# Patient Record
Sex: Female | Born: 1960 | Race: Black or African American | Hispanic: No | Marital: Married | Smoking: Never smoker
Health system: Southern US, Community
[De-identification: ages and names within clinical notes are randomized; demographics above are authoritative.]

## PROBLEM LIST (undated history)

## (undated) DIAGNOSIS — I10 Essential (primary) hypertension: Secondary | ICD-10-CM

---

## 2018-04-24 DIAGNOSIS — Z049 Encounter for examination and observation for unspecified reason: Secondary | ICD-10-CM | POA: Diagnosis not present

## 2018-04-24 LAB — HM PAP SMEAR: HM Pap smear: NORMAL

## 2018-04-25 DIAGNOSIS — Z01419 Encounter for gynecological examination (general) (routine) without abnormal findings: Secondary | ICD-10-CM | POA: Diagnosis not present

## 2018-04-25 DIAGNOSIS — Z111 Encounter for screening for respiratory tuberculosis: Secondary | ICD-10-CM | POA: Diagnosis not present

## 2018-04-25 DIAGNOSIS — Z049 Encounter for examination and observation for unspecified reason: Secondary | ICD-10-CM | POA: Diagnosis not present

## 2018-04-25 DIAGNOSIS — Z206 Contact with and (suspected) exposure to human immunodeficiency virus [HIV]: Secondary | ICD-10-CM | POA: Diagnosis not present

## 2018-04-25 DIAGNOSIS — Z23 Encounter for immunization: Secondary | ICD-10-CM | POA: Diagnosis not present

## 2018-05-07 ENCOUNTER — Ambulatory Visit: Payer: Self-pay | Admitting: Family Medicine

## 2018-05-15 ENCOUNTER — Ambulatory Visit: Payer: Self-pay | Admitting: Family Medicine

## 2018-05-30 ENCOUNTER — Ambulatory Visit (INDEPENDENT_AMBULATORY_CARE_PROVIDER_SITE_OTHER): Payer: Medicaid Other | Admitting: Family Medicine

## 2018-05-30 ENCOUNTER — Other Ambulatory Visit: Payer: Self-pay

## 2018-05-30 ENCOUNTER — Encounter: Payer: Self-pay | Admitting: Family Medicine

## 2018-05-30 ENCOUNTER — Emergency Department (HOSPITAL_COMMUNITY)
Admission: EM | Admit: 2018-05-30 | Discharge: 2018-05-30 | Disposition: A | Discharge status: Home / Self Care | Payer: Medicaid Other | Attending: Emergency Medicine | Admitting: Emergency Medicine

## 2018-05-30 ENCOUNTER — Encounter (HOSPITAL_COMMUNITY): Payer: Self-pay | Admitting: Emergency Medicine

## 2018-05-30 ENCOUNTER — Other Ambulatory Visit: Payer: Self-pay | Admitting: Family Medicine

## 2018-05-30 ENCOUNTER — Emergency Department (HOSPITAL_COMMUNITY): Payer: Medicaid Other

## 2018-05-30 VITALS — BP 151/96 | HR 72 | Resp 17 | Ht 66.0 in | Wt 183.6 lb

## 2018-05-30 DIAGNOSIS — R739 Hyperglycemia, unspecified: Secondary | ICD-10-CM | POA: Insufficient documentation

## 2018-05-30 DIAGNOSIS — R112 Nausea with vomiting, unspecified: Secondary | ICD-10-CM | POA: Diagnosis not present

## 2018-05-30 DIAGNOSIS — I10 Essential (primary) hypertension: Secondary | ICD-10-CM

## 2018-05-30 DIAGNOSIS — R002 Palpitations: Secondary | ICD-10-CM | POA: Diagnosis not present

## 2018-05-30 DIAGNOSIS — Z13220 Encounter for screening for lipoid disorders: Secondary | ICD-10-CM

## 2018-05-30 DIAGNOSIS — R0789 Other chest pain: Secondary | ICD-10-CM | POA: Insufficient documentation

## 2018-05-30 DIAGNOSIS — E119 Type 2 diabetes mellitus without complications: Secondary | ICD-10-CM

## 2018-05-30 DIAGNOSIS — R42 Dizziness and giddiness: Secondary | ICD-10-CM | POA: Diagnosis present

## 2018-05-30 HISTORY — DX: Essential (primary) hypertension: I10

## 2018-05-30 LAB — CBC
HCT: 44.2 % (ref 36.0–46.0)
HEMOGLOBIN: 14.6 g/dL (ref 12.0–15.0)
MCH: 28.1 pg (ref 26.0–34.0)
MCHC: 33 g/dL (ref 30.0–36.0)
MCV: 85 fL (ref 80.0–100.0)
Platelets: 176 10*3/uL (ref 150–400)
RBC: 5.2 MIL/uL — ABNORMAL HIGH (ref 3.87–5.11)
RDW: 11.8 % (ref 11.5–15.5)
WBC: 5.6 10*3/uL (ref 4.0–10.5)
nRBC: 0 % (ref 0.0–0.2)

## 2018-05-30 LAB — BASIC METABOLIC PANEL
ANION GAP: 11 (ref 5–15)
BUN: 14 mg/dL (ref 6–20)
CO2: 22 mmol/L (ref 22–32)
Calcium: 9.7 mg/dL (ref 8.9–10.3)
Chloride: 102 mmol/L (ref 98–111)
Creatinine, Ser: 0.92 mg/dL (ref 0.44–1.00)
GFR calc Af Amer: >60 mL/min (ref >60)
GFR calc non Af Amer: >60 mL/min (ref >60)
Glucose, Bld: 398 mg/dL — ABNORMAL HIGH (ref 70–99)
POTASSIUM: 3.8 mmol/L (ref 3.5–5.1)
Sodium: 135 mmol/L (ref 135–145)

## 2018-05-30 LAB — CBG MONITORING, ED
GLUCOSE-CAPILLARY: 340 mg/dL — AB (ref 70–99)
Glucose-Capillary: 412 mg/dL — ABNORMAL HIGH (ref 70–99)
Glucose-Capillary: 229 mg/dL — ABNORMAL HIGH (ref 70–99)

## 2018-05-30 LAB — GLUCOSE, POCT (MANUAL RESULT ENTRY): POC Glucose: 537 mg/dl — AB (ref 70–99)

## 2018-05-30 LAB — TROPONIN I: Troponin I: <0.03 ng/mL (ref <0.03)

## 2018-05-30 MED ORDER — INSULIN ASPART 100 UNIT/ML ~~LOC~~ SOLN
20.0000 [IU] | Freq: Once | SUBCUTANEOUS | Status: AC
  Administered 2018-05-30: 20 [IU] via SUBCUTANEOUS

## 2018-05-30 MED ORDER — METFORMIN HCL 1000 MG PO TABS: 1000.0000 mg | ORAL_TABLET | Freq: Two times a day (BID) | ORAL | 0 refills | Status: DC

## 2018-05-30 MED ORDER — SODIUM CHLORIDE 0.9 % IV BOLUS
1000.0000 mL | Freq: Once | INTRAVENOUS | Status: AC
  Administered 2018-05-30: 1000 mL via INTRAVENOUS

## 2018-05-30 NOTE — ED Provider Notes (Signed)
[facility] Provider Note   CSN: [id number] Arrival date & time: 05/30/18  1154     History   Chief Complaint Chief Complaint  Patient presents with  . Hypertension    HPI Diane Ponce is a 57 y.o. female.  57 year old female presents to the ER from PCP office for high blood sugar.  Patient went to PCP office today to establish care, was found to have a fasting blood sugar of 537 and was given 20 units of NovoLog prior to being sent to the ER for IV fluids and labs.  Patient reports feeling slightly nauseous, denies headache, dizziness, changes in vision.  Patient was given prescription for metformin with plan to return to PCP office tomorrow to finish her visit.  No other complaints or concerns today.  Patient's son is her translator today, history also obtained from PCP notes from visit today.     Past Medical History:  Diagnosis Date  . Hypertension     There are no active problems to display for this patient.   History reviewed. No pertinent surgical history.   OB History   No obstetric history on file.      Home Medications    Prior to Admission medications   Medication Sig Start Date End Date Taking? Authorizing Provider  metFORMIN (GLUCOPHAGE) 1000 MG tablet Take 1 tablet (1,000 mg total) by mouth 2 (two) times daily with a meal. 05/30/18   Bing NeighborsHarris, Kimberly S, FNP    Family History History reviewed. No pertinent family history.  Social History Social History   Tobacco Use  . Smoking status: Never Smoker  . Smokeless tobacco: Never Used  Substance Use Topics  . Alcohol use: Never    Frequency: Never  . Drug use: Never     Allergies   Patient has no known allergies.   Review of Systems Review of Systems  Constitutional: Negative for chills and fever.  Eyes: Negative for visual disturbance.  Respiratory: Negative for shortness of breath.   Cardiovascular: Negative for chest pain.    Gastrointestinal: Positive for nausea. Negative for abdominal pain, constipation, diarrhea and vomiting.  Allergic/Immunologic: Positive for immunocompromised state.  Neurological: Negative for dizziness, weakness and headaches.  All other systems reviewed and are negative.    Physical Exam Updated Vital Signs BP 135/89 (BP Location: Right Arm)   Pulse 73   Temp 97.6 F (36.4 C) (Oral)   Resp 16   SpO2 100%   Physical Exam Vitals signs and nursing note reviewed.  Constitutional:      General: She is not in acute distress.    Appearance: She is well-developed. She is not diaphoretic.  HENT:     Head: Normocephalic and atraumatic.     Mouth/Throat:     Mouth: Mucous membranes are moist.  Cardiovascular:     Rate and Rhythm: Normal rate and regular rhythm.     Pulses: Normal pulses.     Heart sounds: Normal heart sounds. No murmur.  Pulmonary:     Effort: Pulmonary effort is normal.  Abdominal:     Tenderness: There is no abdominal tenderness.  Skin:    General: Skin is warm and dry.  Neurological:     Mental Status: She is alert and oriented to person, place, and time.  Psychiatric:        Behavior: Behavior normal.      ED Treatments / Results  Labs (all labs ordered are listed, but only abnormal results are displayed)  Labs Reviewed  BASIC METABOLIC PANEL - Abnormal; Notable for the following components:      Result Value   Glucose, Bld 398 (*)    All other components within normal limits  CBC - Abnormal; Notable for the following components:   RBC 5.20 (*)    All other components within normal limits  CBG MONITORING, ED - Abnormal; Notable for the following components:   Glucose-Capillary 412 (*)    All other components within normal limits  CBG MONITORING, ED - Abnormal; Notable for the following components:   Glucose-Capillary 340 (*)    All other components within normal limits  CBG MONITORING, ED - Abnormal; Notable for the following components:    Glucose-Capillary 229 (*)    All other components within normal limits  TROPONIN I  URINALYSIS, ROUTINE W REFLEX MICROSCOPIC    EKG EKG Interpretation  Date/Time:  Thursday May 30 2018 13:21:48 EST Ventricular Rate:  70 PR Interval:  154 QRS Duration: 103 QT Interval:  381 QTC Calculation: 412 R Axis:   12 Text Interpretation:  Sinus rhythm No significant change since last tracing Confirmed by Linwood DibblesKnapp, Jon [phone]([id number]) on 05/30/2018 1:24:19 PM   Radiology Dg Chest 2 View  Result Date: 05/30/2018 CLINICAL DATA:  Hypertension, dizziness, palpitation EXAM: CHEST - 2 VIEW COMPARISON:  None. FINDINGS: Lungs are clear.  No pleural effusion or pneumothorax. The heart is normal in size. Visualized osseous structures are within normal limits. IMPRESSION: Normal chest radiographs. Electronically Signed   By: Charline BillsSriyesh  Krishnan M.D.   On: 05/30/2018 13:23    Procedures Procedures (including critical care time)  Medications Ordered in ED Medications  sodium chloride 0.9 % bolus 1,000 mL (0 mLs Intravenous Stopped 05/30/18 1517)     Initial Impression / Assessment and Plan / ED Course  I have reviewed the triage vital signs and the nursing notes.  Pertinent labs & imaging results that were available during my care of the patient were reviewed by me and considered in my medical decision making (see chart for details).  Clinical Course as of May 30 1553  Thu May 30, 2018  [id number] 57 year old female newly diagnosed with diabetes at PCP office today presents to the ER for IV fluids and labs.  Patient's blood sugar has improved after IV insulin given at her PCP office as well as 1 L normal saline.  Patient will be discharged to follow-up with her PCP tomorrow as planned.   [LM]    Clinical Course User Index [LM] Jeannie FendMurphy,  A, PA-C   Final Clinical Impressions(s) / ED Diagnoses   Final diagnoses:  Hyperglycemia    ED Discharge Orders    None       Jeannie FendMurphy,  A, PA-C 05/30/18  1554    Linwood DibblesKnapp, Jon, MD 05/31/18 [phone]

## 2018-05-30 NOTE — ED Notes (Signed)
Pt. Aware urine is needed.

## 2018-05-30 NOTE — Patient Instructions (Addendum)
[facility] immediately to [facility]. You will need IV fluids and probably additional insulin to treat your hyperglycemia. You will start Metformin 1000 mg once twice daily. We will add medication tomorrow once you labs have resulted   Thank you for choosing [facility]!    Diane Ponce was seen by Joaquin CourtsKimberly Kassidi [name], FNP today.   [name]'s primary care doctor is Bing NeighborsHarris, [name] S, FNP.   For the best care possible,  you should try to see Joaquin CourtsKimberly Sherilee Smotherman, FNP-C  whenever you come to clinic.   We look forward to seeing you again soon!  If you have any questions about your visit today,  please call us at [phone]  Or feel free to reach your provider via MyChart.        Hyperglycemia Hyperglycemia is when the sugar (glucose) level in your blood is too high. It may not cause symptoms. If you do have symptoms, they may include warning signs, such as:  Feeling more thirsty than normal.  Hunger.  Feeling tired.  Needing to pee (urinate) more than normal.  Blurry eyesight (vision). You may get other symptoms as it gets worse, such as:  Dry mouth.  Not being hungry (loss of appetite).  Fruity-smelling breath.  Weakness.  Weight gain or loss that is not planned. Weight loss may be fast.  A tingling or numb feeling in your hands or feet.  Headache.  Skin that does not bounce back quickly when it is lightly pinched and released (poor skin turgor).  Pain in your belly (abdomen).  Cuts or bruises that heal slowly. High blood sugar can happen to people who do or do not have diabetes. High blood sugar can happen slowly or quickly, and it can be an emergency. Follow these instructions at home: General instructions  Take over-the-counter and prescription medicines only as told by your doctor.  Do not use products that contain nicotine or tobacco, such as cigarettes and e-cigarettes. If  you need help quitting, ask your doctor.  Limit alcohol intake to no more than 1 drink per day for nonpregnant women and 2 drinks per day for men. One drink equals 12 oz of beer, 5 oz of wine, or 1 oz of hard liquor.  Manage stress. If you need help with this, ask your doctor.  Keep all follow-up visits as told by your doctor. This is important. Eating and drinking   Stay at a healthy weight.  Exercise regularly, as told by your doctor.  Drink enough fluid, especially when you: ? Exercise. ? Get sick. ? Are in hot temperatures.  Eat healthy foods, such as: ? Low-fat (lean) proteins. ? Complex carbs (complex carbohydrates), such as whole wheat bread or brown rice. ? Fresh fruits and vegetables. ? Low-fat dairy products. ? Healthy fats.  Drink enough fluid to keep your pee (urine) clear or pale yellow. If you have diabetes:   Make sure you know the symptoms of hyperglycemia.  Follow your diabetes management plan, as told by your doctor. Make sure you: ? Take insulin and medicines as told. ? Follow your exercise plan. ? Follow your meal plan. Eat on time. Do not skip meals. ? Check your blood sugar as often as told. Make sure to check before and after exercise. If you exercise longer or in a different way than you normally do, check your blood sugar more often. ? Follow your sick day plan whenever you cannot eat or drink normally.  Make this plan ahead of time with your doctor.  Share your diabetes management plan with people in your workplace, school, and household.  Check your urine for ketones when you are ill and as told by your doctor.  Carry a card or wear jewelry that says that you have diabetes. Contact a doctor if:  Your blood sugar level is higher than 240 mg/dL (16.113.3 mmol/L) for 2 days in a row.  You have problems keeping your blood sugar in your target range.  High blood sugar happens often for you. Get help right away if:  You have trouble  breathing.  You have a change in how you think, feel, or act (mental status).  You feel sick to your stomach (nauseous), and that feeling does not go away.  You cannot stop throwing up (vomiting). These symptoms may be an emergency. Do not wait to see if the symptoms will go away. Get medical help right away. Call your local emergency services (911 in the [place] Do not drive yourself to the hospital. Summary  Hyperglycemia is when the sugar (glucose) level in your blood is too high.  High blood sugar can happen to people who do or do not have diabetes.  Make sure you drink enough fluids, eat healthy foods, and exercise regularly.  Contact your doctor if you have problems keeping your blood sugar in your target range. This information is not intended to replace advice given to you by your health care provider. Make sure you discuss any questions you have with your health care provider. Document Released: 03/19/2009 Document Revised: 02/07/2016 Document Reviewed: 02/07/2016 Elsevier Interactive Patient Education  2019 [employer]

## 2018-05-30 NOTE — Progress Notes (Signed)
Diane Ponce, is a 57 y.o. female  RUE:[id number]  JYN:[id number]  DOB - 10/25/1960  CC:  Chief Complaint  Patient presents with  . Establish Care  . Diabetes  . Hypertension       HPI: Diane Ponce is a 57 y.o. female is here today to establish care.   Diane Ponce does not have a problem list on file.   Today's visit:  Patient presents today as a new patient and glucose 537 fasting. She is being directed to the emergency department due to critical glucose level. She is currently not taking medication for diabetes. [place] and today is first appointment with PCP.  Returning tomorrow to complete visit after further evaluation at the ER. Denies chest pain, shortness of breath, dizziness, or weakness.   Current medications:No current outpatient medications on file.   Pertinent family medical history: family history is not on file.   Not on File  Social History   Socioeconomic History  . Marital status: Married    Spouse name: Not on file  . Number of children: Not on file  . Years of education: Not on file  . Highest education level: Not on file  Occupational History  . Not on file  Social Needs  . Financial resource strain: Not on file  . Food insecurity:    Worry: Not on file    Inability: Not on file  . Transportation needs:    Medical: Not on file    Non-medical: Not on file  Tobacco Use  . Smoking status: Never Smoker  . Smokeless tobacco: Never Used  Substance and Sexual Activity  . Alcohol use: Never    Frequency: Never  . Drug use: Never  . Sexual activity: Not on file  Lifestyle  . Physical activity:    Days per week: Not on file    Minutes per session: Not on file  . Stress: Not on file  Relationships  . Social connections:    Talks on phone: Not on file    Gets together: Not on file    Attends religious service: Not on file    Active member of club or organization: Not on file    Attends meetings of clubs or  organizations: Not on file    Relationship status: Not on file  . Intimate partner violence:    Fear of current or ex partner: Not on file    Emotionally abused: Not on file    Physically abused: Not on file    Forced sexual activity: Not on file  Other Topics Concern  . Not on file  Social History Narrative  . Not on file    Review of Systems: Deferred Objective:   Vitals:   05/30/18 1028  BP: (!) 151/96  Pulse: 72  Resp: 17  SpO2: 96%    BP Readings from Last 3 Encounters:  05/30/18 (!) 151/96    Filed Weights   05/30/18 1028  Weight: 183 lb 9.6 oz (83.3 kg)      Physical Exam: Constitutional: Patient appears well-developed and well-nourished. No distress. HENT: Normocephalic, atraumatic, External right and left ear normal. Oropharynx is clear and moist.  Eyes: Conjunctivae and EOM are normal. PERRLA, no scleral icterus. Neck: Normal ROM. Neck supple. No JVD. No tracheal deviation. No thyromegaly. CVS: RRR, S1/S2 +, no murmurs, no gallops, no carotid bruit.  Pulmonary: Effort and breath sounds normal, no stridor, rhonchi, wheezes, rales.  Psychiatric: Normal mood and affect. Behavior, judgment, thought content normal.  Assessment and plan:  1. Type 2 diabetes mellitus without complication, without long-term current use of insulin (HCC) - Glucose (CBG) - Hemoglobin A1c  2. Hyperglycemia, fasting  Blood sugar on arrival 537, referring to ER for further evaluation. Gave Iinsulin aspart (novoLOG) injection 20 Units, prior to discharge. Patient will need fluids.   Return for follow-up tomorrow at 10:10 .   The patient was given clear instructions to go to ER or return to medical center if symptoms don't improve, worsen or new problems develop. The patient verbalized understanding. The patient was advised  to call and obtain lab results if they haven't heard anything from out office within 7-10 business days.  Joaquin CourtsKimberly Taegan Standage, FNP [facility] [address], [place] [id number] [phone]: [phone]    This note has been created with Dragon speech recognition software and Paediatric nursesmart phrase technology. Any transcriptional errors are unintentional.

## 2018-05-30 NOTE — ED Triage Notes (Signed)
Pt was sent over by PCP for high blood pressure. Pt reports hx of high blood pressure, but does not take any medications. Pt reports dizziness since Monday. Pt reports palpitations, denies chest pain.

## 2018-05-30 NOTE — ED Provider Notes (Signed)
Patient placed in Quick Look pathway, seen and evaluated   Chief Complaint: Hypertension, dizziness, nausea  HPI:   Patient reports she has history of hypertension for 3 years.  She does not take any medication.  For the past month, she has had intermittent dizziness and nausea.  She was seen at her primary care provider today and sent here for further evaluation.  ROS: + chest pain, + intermittent shortness of breath, + nausea, -vomiting, -abdominal pain  Physical Exam:   Gen: No distress  Neuro: Awake and Alert  Skin: Warm    Focused Exam: Heart normal rate and rhythm, lungs clear to auscultation, abdomen soft and nontender CN 3-12 intact; normal sensation throughout; 5/5 strength in all 4 extremities; equal bilateral grip strength    Initiation of care has begun. The patient has been counseled on the process, plan, and necessity for staying for the completion/evaluation, and the remainder of the medical screening examination    Emi HolesLaw, Aubrynn Katona M, PA-C 05/30/18 1227    Melene PlanFloyd, Dan, DO 05/30/18 1333

## 2018-05-30 NOTE — Discharge Instructions (Addendum)
Take metformin as prescribed by your doctor today. Return to your doctor's office tomorrow for appointment.

## 2018-05-31 ENCOUNTER — Ambulatory Visit: Payer: Medicaid Other | Admitting: Family Medicine

## 2018-05-31 ENCOUNTER — Telehealth: Payer: Self-pay | Admitting: Family Medicine

## 2018-05-31 LAB — THYROID PANEL WITH TSH
Free Thyroxine Index: 2.4 (ref 1.2–4.9)
T3 Uptake Ratio: 27 % (ref 24–39)
T4, Total: 8.8 ug/dL (ref 4.5–12.0)
TSH: 0.307 u[IU]/mL — ABNORMAL LOW (ref 0.450–4.500)

## 2018-05-31 LAB — LIPID PANEL
CHOL/HDL RATIO: 6.5 ratio — AB (ref 0.0–4.4)
Cholesterol, Total: 229 mg/dL — ABNORMAL HIGH (ref 100–199)
HDL: 35 mg/dL — ABNORMAL LOW (ref >39)
LDL Calculated: 137 mg/dL — ABNORMAL HIGH (ref 0–99)
Triglycerides: 286 mg/dL — ABNORMAL HIGH (ref 0–149)
VLDL Cholesterol Cal: 57 mg/dL — ABNORMAL HIGH (ref 5–40)

## 2018-05-31 LAB — HEMOGLOBIN A1C
Est. average glucose Bld gHb Est-mCnc: 301 mg/dL
HEMOGLOBIN A1C: 12.1 % — AB (ref 4.8–5.6)

## 2018-05-31 NOTE — Telephone Encounter (Signed)
erroneous

## 2018-06-03 ENCOUNTER — Encounter: Payer: Self-pay | Admitting: Family Medicine

## 2018-06-03 NOTE — Progress Notes (Signed)
Discuss f/u visit 06/11/18

## 2018-06-11 ENCOUNTER — Encounter: Payer: Self-pay | Admitting: Family Medicine

## 2018-06-11 ENCOUNTER — Ambulatory Visit (INDEPENDENT_AMBULATORY_CARE_PROVIDER_SITE_OTHER): Payer: Medicaid Other | Admitting: Family Medicine

## 2018-06-11 VITALS — BP 144/93 | HR 63 | Resp 17 | Ht 66.0 in | Wt 183.8 lb

## 2018-06-11 DIAGNOSIS — E119 Type 2 diabetes mellitus without complications: Secondary | ICD-10-CM

## 2018-06-11 DIAGNOSIS — E1165 Type 2 diabetes mellitus with hyperglycemia: Secondary | ICD-10-CM

## 2018-06-11 DIAGNOSIS — I10 Essential (primary) hypertension: Secondary | ICD-10-CM | POA: Diagnosis not present

## 2018-06-11 DIAGNOSIS — R739 Hyperglycemia, unspecified: Secondary | ICD-10-CM

## 2018-06-11 LAB — GLUCOSE, POCT (MANUAL RESULT ENTRY): POC Glucose: 307 mg/dl — AB (ref 70–99)

## 2018-06-11 MED ORDER — LOSARTAN POTASSIUM 50 MG PO TABS: 50.0000 mg | ORAL_TABLET | Freq: Every day | ORAL | 3 refills | Status: DC

## 2018-06-11 MED ORDER — METFORMIN HCL 1000 MG PO TABS: 1000.0000 mg | ORAL_TABLET | Freq: Two times a day (BID) | ORAL | 3 refills | Status: DC

## 2018-06-11 MED ORDER — INSULIN PEN NEEDLE 30G X 8 MM MISC: 1.0000 | 11 refills | Status: DC | PRN

## 2018-06-11 MED ORDER — INSULIN ASPART 100 UNIT/ML ~~LOC~~ SOLN
10.0000 [IU] | Freq: Once | SUBCUTANEOUS | Status: AC
  Administered 2018-06-11: 10 [IU] via SUBCUTANEOUS

## 2018-06-11 MED ORDER — BLOOD GLUCOSE METER KIT: PACK | 0 refills | Status: DC

## 2018-06-11 MED ORDER — HYDROCHLOROTHIAZIDE 12.5 MG PO TABS: 12.5000 mg | ORAL_TABLET | Freq: Every day | ORAL | 3 refills | Status: DC

## 2018-06-11 MED ORDER — INSULIN GLARGINE 100 UNIT/ML SOLOSTAR PEN: 10.0000 [IU] | PEN_INJECTOR | Freq: Every day | SUBCUTANEOUS | 99 refills | Status: DC

## 2018-06-11 NOTE — Progress Notes (Signed)
Established Patient Office Visit  Subjective:  Patient ID: Diane Ponce, female    DOB: Aug 13, 1960  Age: 58 y.o. MRN: [id number]  CC:  Chief Complaint  Patient presents with  . Diabetes  . Hypertension  . Hyperlipidemia   HPI Diane Ponce presents for evaluattion of diabetes, hypertension, and hyperlipidemia. Patient reports a history of diabetes which was managed in her home country. She presented to the office over a week ago and was found to have blood glucose 537 with ketones present in urine. She was sent to the ER for further evaluation and management.  Started on metformin 1000 mg once daily. A1C >12. She will presents today for follow-up. She does not check her blood sugar.  There is a language barrier.Patient is an immigrant from [place]. She was unaware that she had diabetes however has been treated in the past for high blood pressure. Currently not described any medication for hypertension.  Denies chest pain, shortness of breath, weakness, numbness and tingling of distal extremities,, or new weakness. Past Medical History:  Diagnosis Date  . Hypertension     Social History   Socioeconomic History  . Marital status: Married    Spouse name: Not on file  . Number of children: Not on file  . Years of education: Not on file  . Highest education level: Not on file  Occupational History  . Not on file  Social Needs  . Financial resource strain: Not on file  . Food insecurity:    Worry: Not on file    Inability: Not on file  . Transportation needs:    Medical: Not on file    Non-medical: Not on file  Tobacco Use  . Smoking status: Never Smoker  . Smokeless tobacco: Never Used  Substance and Sexual Activity  . Alcohol use: Never    Frequency: Never  . Drug use: Never  . Sexual activity: Not on file  Lifestyle  . Physical activity:    Days per week: Not on file    Minutes per session: Not on file  . Stress: Not on file  Relationships  .  Social connections:    Talks on phone: Not on file    Gets together: Not on file    Attends religious service: Not on file    Active member of club or organization: Not on file    Attends meetings of clubs or organizations: Not on file    Relationship status: Not on file  . Intimate partner violence:    Fear of current or ex partner: Not on file    Emotionally abused: Not on file    Physically abused: Not on file    Forced sexual activity: Not on file  Other Topics Concern  . Not on file  Social History Narrative  . Not on file    Outpatient Medications Prior to Visit  Medication Sig Dispense Refill  . metFORMIN (GLUCOPHAGE) 1000 MG tablet TAKE 1 TABLET BY MOUTH TWICE DAILY WITH A MEAL (Patient not taking: Reported on 06/11/2018) 180 tablet 1   No facility-administered medications prior to visit.     No Known Allergies  ROS Review of Systems    Pertinent negatives listed in HPI Objective:    Physical Exam BP (!) 144/93   Pulse 63   Resp 17   Ht 5' 6"  (1.676 m)   Wt 183 lb 12.8 oz (83.4 kg)   SpO2 96%   BMI 29.67 kg/m  Wt Readings from Last 3  Encounters:  06/11/18 183 lb 12.8 oz (83.4 kg)  05/30/18 183 lb 9.6 oz (83.3 kg)   General appearance: alert, well developed, well nourished, cooperative and in no distress Head: Normocephalic, without obvious abnormality, atraumatic Respiratory: Respirations even and unlabored, normal respiratory rate Heart: RRR normal, S1+, S2 +, no mumurs, no gallops  Extremities: No gross deformities Skin: Skin color, texture, turgor normal. No rashes seen  Psych: Appropriate mood and affect. Neurologic: Mental status: Alert, oriented to person, place, and time, thought content appropriate. Health Maintenance Due  Topic Date Due  . Hepatitis C Screening  1960-11-19  . URINE MICROALBUMIN  06/05/1972  . HIV Screening  06/05/1977  . TETANUS/TDAP  06/05/1981  . MAMMOGRAM  06/05/2012  . COLONOSCOPY  06/05/2012  . INFLUENZA VACCINE   01/03/2018    There are no preventive care reminders to display for this patient.  Lab Results  Component Value Date   TSH 0.307 (L) 05/30/2018   Lab Results  Component Value Date   WBC 5.6 05/30/2018   HGB 14.6 05/30/2018   HCT 44.2 05/30/2018   MCV 85.0 05/30/2018   PLT 176 05/30/2018   Lab Results  Component Value Date   NA 135 05/30/2018   K 3.8 05/30/2018   CO2 22 05/30/2018   GLUCOSE 398 (H) 05/30/2018   BUN 14 05/30/2018   CREATININE 0.92 05/30/2018   CALCIUM 9.7 05/30/2018   ANIONGAP 11 05/30/2018   Lab Results  Component Value Date   CHOL 229 (H) 05/30/2018   Lab Results  Component Value Date   HDL 35 (L) 05/30/2018   Lab Results  Component Value Date   LDLCALC 137 (H) 05/30/2018   Lab Results  Component Value Date   TRIG 286 (H) 05/30/2018   Lab Results  Component Value Date   CHOLHDL 6.5 (H) 05/30/2018   Lab Results  Component Value Date   HGBA1C 12.1 (H) 05/30/2018      Assessment & Plan:  1. Type 2 diabetes mellitus without complication, without long-term current use of insulin ([place]),- Glucose (CBG), 307 today. Never picked up metformin. -Even A1c indicating severely uncontrolled diabetes will start on Lantus 10 units once daily Continue metformin 1000 mg twice daily. Patient was demonstrated use of insulin and I was able to return demonstration and understands how to use.  Instructions were written in English she reports she has someone in home that can review the instructions.  2. Essential hypertension, uncontrolled Will start losartan 50 mg once daily. Education provided regarding reducing sodium.  TSH indicates overactive thyroid.  Will repeat TSH at next follow-up.  3. Hyperglycemia, BG 307 - insulin aspart (novoLOG) injection 10 Units   Orders Placed This Encounter  Procedures  . Glucose (CBG)   Meds ordered this encounter  Medications  . Insulin Glargine (LANTUS SOLOSTAR) 100 UNIT/ML Solostar Pen    Sig: Inject 10 Units  into the skin daily.    Dispense:  5 pen    Refill:  PRN  . metFORMIN (GLUCOPHAGE) 1000 MG tablet    Sig: Take 1 tablet (1,000 mg total) by mouth 2 (two) times daily with a meal.    Dispense:  180 tablet    Refill:  3  . Insulin Pen Needle (NOVOFINE) 30G X 8 MM MISC    Sig: Inject 10 each into the skin as needed.    Dispense:  100 each    Refill:  11  . blood glucose meter kit and supplies    Sig: Dispense  based on patient and insurance preference. Use up to four times daily as directed. (FOR ICD-10 E10.9, E11.9).    Dispense:  1 each    Refill:  0    Order Specific Question:   Number of strips    Answer:   300    Order Specific Question:   Number of lancets    Answer:   497  . insulin aspart (novoLOG) injection 10 Units  . hydrochlorothiazide (HYDRODIURIL) 12.5 MG tablet    Sig: Take 1 tablet (12.5 mg total) by mouth daily.    Dispense:  90 tablet    Refill:  3  . losartan (COZAAR) 50 MG tablet    Sig: Take 1 tablet (50 mg total) by mouth daily.    Dispense:  90 tablet    Refill:  3   Current in 4 weeks for diabetes hypertension follow-up.  Molli Barrows, FNP Primary Care at [facility] [address], [place] [phone]: [phone]

## 2018-06-11 NOTE — Patient Instructions (Signed)
I am starting you today on Lantus 10 units to be given in the abdomen prior to bedtime.  You should rotate sites of injections to reduce risk of skin complications. I have also prescribed you a glucose meter to check your blood sugar.  Blood sugar readings less than 125 you should not administer Lantus.  You will continue to take metformin 1000 mg twice daily with food. I am starting you on blood pressure medication losartan 50 mg once daily and hydrochlorothiazide 12.5 mg once daily to reduce swelling and to help improve blood pressure control.  You will return in 4 weeks for diabetes follow-up visit.     Blood Glucose Monitoring, Adult Monitoring your blood sugar (glucose) is an important part of managing your diabetes (diabetes mellitus). Blood glucose monitoring involves checking your blood glucose as often as directed and keeping a record (log) of your results over time. Checking your blood glucose regularly and keeping a blood glucose log can:  Help you and your health care provider adjust your diabetes management plan as needed, including your medicines or insulin.  Help you understand how food, exercise, illnesses, and medicines affect your blood glucose.  Let you know what your blood glucose is at any time. You can quickly find out if you have low blood glucose (hypoglycemia) or high blood glucose (hyperglycemia). Your health care provider will set individualized treatment goals for you. Your goals will be based on your age, other medical conditions you have, and how you respond to diabetes treatment. Generally, the goal of treatment is to maintain the following blood glucose levels:  Before meals (preprandial): 80-130 mg/dL (7.6-7.2 mmol/L).  After meals (postprandial): below 180 mg/dL (10 mmol/L).  A1c level: less than 7%. Supplies needed:  Blood glucose meter.  Test strips for your meter. Each meter has its own strips. You must use the strips that came with your meter.  A  needle to prick your finger (lancet). Do not use a lancet more than one time.  A device that holds the lancet (lancing device).  A journal or log book to write down your results. How to check your blood glucose  1. Wash your hands with soap and water. 2. Prick the side of your finger (not the tip) with the lancet. Use a different finger each time. 3. Gently rub the finger until a small drop of blood appears. 4. Follow instructions that come with your meter for inserting the test strip, applying blood to the strip, and using your blood glucose meter. 5. Write down your result and any notes. Some meters allow you to use areas of your body other than your finger (alternative sites) to test your blood. The most common alternative sites are:  Forearm.  Thigh.  Palm of the hand. If you think you may have hypoglycemia, or if you have a history of not knowing when your blood glucose is getting low (hypoglycemia unawareness), do not use alternative sites. Use your finger instead. Alternative sites may not be as accurate as the fingers, because blood flow is slower in these areas. This means that the result you get may be delayed, and it may be different from the result that you would get from your finger. Follow these instructions at home: Blood glucose log   Every time you check your blood glucose, write down your result. Also write down any notes about things that may be affecting your blood glucose, such as your diet and exercise for the day. This information can help  you and your health care provider: ? Look for patterns in your blood glucose over time. ? Adjust your diabetes management plan as needed.  Check if your meter allows you to download your records to a computer. Most glucose meters store a record of glucose readings in the meter. If you have type 1 diabetes:  Check your blood glucose 2 or more times a day.  Also check your blood glucose: ? Before every insulin  injection. ? Before and after exercise. ? Before meals. ? 2 hours after a meal. ? Occasionally between 2:00 a.m. and 3:00 a.m., as directed. ? Before potentially dangerous tasks, like driving or using heavy machinery. ? At bedtime.  You may need to check your blood glucose more often, up to 6-10 times a day, if you: ? Use an insulin pump. ? Need multiple daily injections (MDI). ? Have diabetes that is not well-controlled. ? Are ill. ? Have a history of severe hypoglycemia. ? Have hypoglycemia unawareness. If you have type 2 diabetes:  If you take insulin or other diabetes medicines, check your blood glucose 2 or more times a day.  If you are on intensive insulin therapy, check your blood glucose 4 or more times a day. Occasionally, you may also need to check between 2:00 a.m. and 3:00 a.m., as directed.  Also check your blood glucose: ? Before and after exercise. ? Before potentially dangerous tasks, like driving or using heavy machinery.  You may need to check your blood glucose more often if: ? Your medicine is being adjusted. ? Your diabetes is not well-controlled. ? You are ill. General tips  Always keep your supplies with you.  If you have questions or need help, all blood glucose meters have a 24-hour "hotline" phone number that you can call. You may also contact your health care provider.  After you use a few boxes of test strips, adjust (calibrate) your blood glucose meter by following instructions that came with your meter. Contact a health care provider if:  Your blood glucose is at or above 240 mg/dL (19.4 mmol/L) for 2 days in a row.  You have been sick or have had a fever for 2 days or longer, and you are not getting better.  You have any of the following problems for more than 6 hours: ? You cannot eat or drink. ? You have nausea or vomiting. ? You have diarrhea. Get help right away if:  Your blood glucose is lower than 60 mg/dL.  You become confused or  you have trouble thinking clearly.  You have difficulty breathing.  You have moderate or large ketone levels in your urine. Summary  Monitoring your blood sugar (glucose) is an important part of managing your diabetes (diabetes mellitus).  Blood glucose monitoring involves checking your blood glucose as often as directed and keeping a record (log) of your results over time.  Your health care provider will set individualized treatment goals for you. Your goals will be based on your age, other medical conditions you have, and how you respond to diabetes treatment.  Every time you check your blood glucose, write down your result. Also write down any notes about things that may be affecting your blood glucose, such as your diet and exercise for the day. This information is not intended to replace advice given to you by your health care provider. Make sure you discuss any questions you have with your health care provider. Document Released: 05/25/2003 Document Revised: 04/02/2017 Document Reviewed: 11/01/2015 Elsevier  Interactive Patient Education  [employer].

## 2018-07-08 ENCOUNTER — Ambulatory Visit: Payer: Medicaid Other | Admitting: Family Medicine

## 2018-07-31 ENCOUNTER — Ambulatory Visit (INDEPENDENT_AMBULATORY_CARE_PROVIDER_SITE_OTHER): Payer: Medicaid Other | Admitting: Family Medicine

## 2018-07-31 VITALS — BP 162/95 | HR 75 | Resp 17 | Ht 66.0 in | Wt 188.0 lb

## 2018-07-31 DIAGNOSIS — E1165 Type 2 diabetes mellitus with hyperglycemia: Secondary | ICD-10-CM

## 2018-07-31 DIAGNOSIS — I10 Essential (primary) hypertension: Secondary | ICD-10-CM

## 2018-07-31 DIAGNOSIS — R81 Glycosuria: Secondary | ICD-10-CM | POA: Diagnosis not present

## 2018-07-31 DIAGNOSIS — R739 Hyperglycemia, unspecified: Secondary | ICD-10-CM

## 2018-07-31 DIAGNOSIS — E119 Type 2 diabetes mellitus without complications: Secondary | ICD-10-CM

## 2018-07-31 LAB — POCT URINALYSIS DIP (CLINITEK)
Bilirubin, UA: NEGATIVE
Blood, UA: NEGATIVE
Glucose, UA: >=1000 mg/dL — AB
Ketones, POC UA: NEGATIVE mg/dL
Leukocytes, UA: NEGATIVE
Nitrite, UA: NEGATIVE
POC PROTEIN,UA: NEGATIVE
UROBILINOGEN UA: 0.2 U/dL
pH, UA: 5.5 (ref 5.0–8.0)

## 2018-07-31 LAB — GLUCOSE, POCT (MANUAL RESULT ENTRY)
POC GLUCOSE: 413 mg/dL — AB (ref 70–99)
POC Glucose: 341 mg/dl — AB (ref 70–99)

## 2018-07-31 MED ORDER — HYDROCHLOROTHIAZIDE 12.5 MG PO TABS: 12.5000 mg | ORAL_TABLET | Freq: Every day | ORAL | 3 refills | Status: DC

## 2018-07-31 MED ORDER — GLIPIZIDE 5 MG PO TABS: 5.0000 mg | ORAL_TABLET | Freq: Every day | ORAL | 3 refills | Status: DC

## 2018-07-31 MED ORDER — METFORMIN HCL 1000 MG PO TABS: 1000.0000 mg | ORAL_TABLET | Freq: Two times a day (BID) | ORAL | 3 refills | Status: DC

## 2018-07-31 MED ORDER — HYDROCHLOROTHIAZIDE 12.5 MG PO TABS: 12.5000 mg | ORAL_TABLET | Freq: Every day | ORAL | 3 refills | Status: AC

## 2018-07-31 MED ORDER — INSULIN ASPART 100 UNIT/ML ~~LOC~~ SOLN
20.0000 [IU] | Freq: Once | SUBCUTANEOUS | Status: AC
  Administered 2018-07-31: 20 [IU] via SUBCUTANEOUS

## 2018-07-31 MED ORDER — LOSARTAN POTASSIUM 50 MG PO TABS: 50.0000 mg | ORAL_TABLET | Freq: Every day | ORAL | 3 refills | Status: AC

## 2018-07-31 MED ORDER — SITAGLIPTIN PHOSPHATE 50 MG PO TABS: 50.0000 mg | ORAL_TABLET | Freq: Every day | ORAL | 2 refills | Status: DC

## 2018-07-31 MED ORDER — SODIUM CHLORIDE 0.9 % IV SOLN: Freq: Once | INTRAVENOUS | Status: DC

## 2018-07-31 MED ORDER — CLONIDINE HCL 0.1 MG PO TABS
0.1000 mg | ORAL_TABLET | Freq: Once | ORAL | Status: AC
  Administered 2018-07-31: 0.1 mg via ORAL

## 2018-07-31 MED ORDER — METFORMIN HCL 1000 MG PO TABS: 1000.0000 mg | ORAL_TABLET | Freq: Two times a day (BID) | ORAL | 3 refills | Status: AC

## 2018-07-31 NOTE — Patient Instructions (Addendum)
Take all medications as prescribed. Bring all medications to next appointment.  I have discontinued insulin and you should only take medications listed on today's paperwork.      Diabetes Basics  Diabetes (diabetes mellitus) is a long-term (chronic) disease. It occurs when the body does not properly use sugar (glucose) that is released from food after you eat. Diabetes may be caused by one or both of these problems:  Your pancreas does not make enough of a hormone called insulin.  Your body does not react in a normal way to insulin that it makes. Insulin lets sugars (glucose) go into cells in your body. This gives you energy. If you have diabetes, sugars cannot get into cells. This causes high blood sugar (hyperglycemia). Follow these instructions at home: How is diabetes treated? You may need to take insulin or other diabetes medicines daily to keep your blood sugar in balance. Take your diabetes medicines every day as told by your doctor. List your diabetes medicines here: Diabetes medicines  Name of medicine: ______________________________ ? Amount (dose): _______________ Time (a.m./p.m.): _______________ Notes: ___________________________________  Name of medicine: ______________________________ ? Amount (dose): _______________ Time (a.m./p.m.): _______________ Notes: ___________________________________  Name of medicine: ______________________________ ? Amount (dose): _______________ Time (a.m./p.m.): _______________ Notes: ___________________________________ If you use insulin, you will learn how to give yourself insulin by injection. You may need to adjust the amount based on the food that you eat. List the types of insulin you use here: Insulin  Insulin type: ______________________________ ? Amount (dose): _______________ Time (a.m./p.m.): _______________ Notes: ___________________________________  Insulin type: ______________________________ ? Amount (dose):  _______________ Time (a.m./p.m.): _______________ Notes: ___________________________________  Insulin type: ______________________________ ? Amount (dose): _______________ Time (a.m./p.m.): _______________ Notes: ___________________________________  Insulin type: ______________________________ ? Amount (dose): _______________ Time (a.m./p.m.): _______________ Notes: ___________________________________  Insulin type: ______________________________ ? Amount (dose): _______________ Time (a.m./p.m.): _______________ Notes: ___________________________________ How do I manage my blood sugar?  Check your blood sugar levels using a blood glucose monitor as directed by your doctor. Your doctor will set treatment goals for you. Generally, you should have these blood sugar levels:  Before meals (preprandial): 80-130 mg/dL (3.8-4.6 mmol/L).  After meals (postprandial): below 180 mg/dL (10 mmol/L).  A1c level: less than 7%. Write down the times that you will check your blood sugar levels: Blood sugar checks  Time: _______________ Notes: ___________________________________  Time: _______________ Notes: ___________________________________  Time: _______________ Notes: ___________________________________  Time: _______________ Notes: ___________________________________  Time: _______________ Notes: ___________________________________  Time: _______________ Notes: ___________________________________  What do I need to know about low blood sugar? Low blood sugar is called hypoglycemia. This is when blood sugar is at or below 70 mg/dL (3.9 mmol/L). Symptoms may include:  Feeling: ? Hungry. ? Worried or nervous (anxious). ? Sweaty and clammy. ? Confused. ? Dizzy. ? Sleepy. ? Sick to your stomach (nauseous).  Having: ? A fast heartbeat. ? A headache. ? A change in your vision. ? Tingling or no feeling (numbness) around the mouth, lips, or tongue. ? Jerky movements that you cannot  control (seizure).  Having trouble with: ? Moving (coordination). ? Sleeping. ? Passing out (fainting). ? Getting upset easily (irritability). Treating low blood sugar To treat low blood sugar, eat or drink something sugary right away. If you can think clearly and swallow safely, follow the 15:15 rule:  Take 15 grams of a fast-acting carb (carbohydrate). Talk with your doctor about how much you should take.  Some fast-acting carbs are: ? Sugar tablets (glucose pills). Take 3-4 glucose pills. ? 6-8  pieces of hard candy. ? 4-6 oz (120-150 mL) of fruit juice. ? 4-6 oz (120-150 mL) of regular (not diet) soda. ? 1 Tbsp (15 mL) honey or sugar.  Check your blood sugar 15 minutes after you take the carb.  If your blood sugar is still at or below 70 mg/dL (3.9 mmol/L), take 15 grams of a carb again.  If your blood sugar does not go above 70 mg/dL (3.9 mmol/L) after 3 tries, get help right away.  After your blood sugar goes back to normal, eat a meal or a snack within 1 hour. Treating very low blood sugar If your blood sugar is at or below 54 mg/dL (3 mmol/L), you have very low blood sugar (severe hypoglycemia). This is an emergency. Do not wait to see if the symptoms will go away. Get medical help right away. Call your local emergency services (911 in the [place] Do not drive yourself to the hospital. Questions to ask your health care provider  Do I need to meet with a diabetes educator?  What equipment will I need to care for myself at home?  What diabetes medicines do I need? When should I take them?  How often do I need to check my blood sugar?  What number can I call if I have questions?  When is my next doctor's visit?  Where can I find a support group for people with diabetes? Where to find more information  American Diabetes Association: www.diabetes.org  American Association of Diabetes Educators: www.diabeteseducator.org/patient-resources Contact a doctor if:  Your  blood sugar is at or above 240 mg/dL (45.0 mmol/L) for 2 days in a row.  You have been sick or have had a fever for 2 days or more, and you are not getting better.  You have any of these problems for more than 6 hours: ? You cannot eat or drink. ? You feel sick to your stomach (nauseous). ? You throw up (vomit). ? You have watery poop (diarrhea). Get help right away if:  Your blood sugar is lower than 54 mg/dL (3 mmol/L).  You get confused.  You have trouble: ? Thinking clearly. ? Breathing. Summary  Diabetes (diabetes mellitus) is a long-term (chronic) disease. It occurs when the body does not properly use sugar (glucose) that is released from food after digestion.  Take insulin and diabetes medicines as told.  Check your blood sugar every day, as often as told.  Keep all follow-up visits as told by your doctor. This is important. This information is not intended to replace advice given to you by your health care provider. Make sure you discuss any questions you have with your health care provider. Document Released: 08/24/2017 Document Revised: 11/12/2017 Document Reviewed: 08/24/2017 Elsevier Interactive Patient Education  2019 [employer].

## 2018-07-31 NOTE — Progress Notes (Signed)
Patient ID: Diane Ponce, female    DOB: Sep 06, 1960, 58 y.o.   MRN: [id number]  PCP: [name], FNP  Chief Complaint  Patient presents with  . Diabetes  . Hypertension    Subjective:  HPI Diane Ponce is a 58 y.o. female presents for evaluation hypertension and diabetes.  Diabetes Patient presents today and reports that she is out of medication. She was unable to remember how to use the insulin. Broke the insulin pen and therefore never resumed use. She is taking metformin. On arrival blood sugar is 413. She states " she feels this isn't a big deal". She doesn't monitor blood sugar at home as she is unable to read. She reports no one in household is able to read.  Denies symptoms of urine frequency, increased thirst, fatigue, or polyphagia. She has not had any remarkable weight loss since last visit. She has gained 5 lbs. Endorses a regular diet without restriction of high starch. Most recent A1C 12.1.  Hypertension Blood pressure is elevated on arrival. She doesn't know if she is taking blood pressure medication as she reports she is taking whatever "pills were given". She did not bring medications to office visit. No home monitoring of blood pressure. Denies chest pain, dizziness, weakness, or  shortness of breath. Social History   Socioeconomic History  . Marital status: Married    Spouse name: Not on file  . Number of children: Not on file  . Years of education: Not on file  . Highest education level: Not on file  Occupational History  . Not on file  Social Needs  . Financial resource strain: Not on file  . Food insecurity:    Worry: Not on file    Inability: Not on file  . Transportation needs:    Medical: Not on file    Non-medical: Not on file  Tobacco Use  . Smoking status: Never Smoker  . Smokeless tobacco: Never Used  Substance and Sexual Activity  . Alcohol use: Never    Frequency: Never  . Drug use: Never  . Sexual activity:  Not on file  Lifestyle  . Physical activity:    Days per week: Not on file    Minutes per session: Not on file  . Stress: Not on file  Relationships  . Social connections:    Talks on phone: Not on file    Gets together: Not on file    Attends religious service: Not on file    Active member of club or organization: Not on file    Attends meetings of clubs or organizations: Not on file    Relationship status: Not on file  . Intimate partner violence:    Fear of current or ex partner: Not on file    Emotionally abused: Not on file    Physically abused: Not on file    Forced sexual activity: Not on file  Other Topics Concern  . Not on file  Social History Narrative  . Not on file   Family history is unavailable as patient is refugee from [place]    Review of Systems Pertinent negatives listed in HPI There are no active problems to display for this patient.   No Known Allergies  Prior to Admission medications   Medication Sig Start Date End Date Taking? Authorizing Provider  hydrochlorothiazide (HYDRODIURIL) 12.5 MG tablet Take 1 tablet (12.5 mg total) by mouth daily. 06/11/18  Yes [name], FNP  losartan (COZAAR) 50 MG tablet Take 1  tablet (50 mg total) by mouth daily. 06/11/18  Yes [name], FNP  metFORMIN (GLUCOPHAGE) 1000 MG tablet Take 1 tablet (1,000 mg total) by mouth 2 (two) times daily with a meal. 06/11/18  Yes [name], FNP  Insulin Glargine (LANTUS SOLOSTAR) 100 UNIT/ML Solostar Pen Inject 10 Units into the skin daily. Patient not taking: Reported on 07/31/2018 06/11/18   [name], FNP  Insulin Pen Needle (NOVOFINE) 30G X 8 MM MISC Inject 10 each into the skin as needed. Patient not taking: Reported on 07/31/2018 06/11/18   [name], FNP    Past Medical, Surgical Family and Social History reviewed and updated.    Objective:   Today's Vitals   07/31/18 1014  BP: (!) 162/95  Pulse: 75  Resp: 17  SpO2: 95%  Weight: 188  lb (85.3 kg)  Height: 5\' 6"  (1.676 m)    Wt Readings from Last 3 Encounters:  07/31/18 188 lb (85.3 kg)  06/11/18 183 lb 12.8 oz (83.4 kg)  05/30/18 183 lb 9.6 oz (83.3 kg)    Physical Exam General appearance: alert, well developed, well nourished, cooperative and in no distress Head: Normocephalic, without obvious abnormality, atraumatic Respiratory: Respirations even and unlabored, normal respiratory rate Heart: rate and rhythm normal. No gallop or murmurs noted on exam  Extremities: No gross deformities Skin: Skin color, texture, turgor normal. No rashes seen  Psych: Appropriate mood and affect. Neurologic: Mental status: Alert, oriented to person, place, and time, thought content appropriate.  Lab Results  Component Value Date   POCGLU 413 (A) 07/31/2018   POCGLU 307 (A) 06/11/2018   POCGLU 537 (A) 05/30/2018    Lab Results  Component Value Date   HGBA1C 12.1 (H) 05/30/2018    Assessment & Plan:  1. Type 2 diabetes mellitus without complication, without long-term current use of insulin (HCC) -Discontinue Lantus giving low health literacy and increased risk of medication misuse. Continue metformin 1000 mg twice daily Start glipizide 5 mg once daily.  Reinforced the importance of only taking this medication with food.  This information will also be reiterated to patient social worker who brings her back and forth the office visits. Adding Januvia to milligrams once daily. We will keep patient on regimen for a total of 30 days and repeat A1c at the end of March.  2. Hyperglycemia - POCT URINALYSIS DIP (CLINITEK), no ketones -Novolog 20 units given  - Insert peripheral IV - 0.9 %  sodium chloride infusion Infusion stopped as IV infiltrated. Patient initially refused to have IV replaced then changed mind and was agreeable to have a new IV placed. This all occurred 10 minutes prior to close of office and RN was unavailable to restart IV.  - Glucose (CBG), glucose reduced to  385 on recheck. Patient is stable. Will discharge patient and have her return to office within 5 days for evaluation. New medications prescribed all in oral form which will hopefully facilitate adherence. 3. Accelerated hypertension -refilled Losartan -added HCTZ Clonidine provided in office for elevated BP.    Nurse spoke with patient's social worker regarding patient's barrier to understanding medications and even the process of getting medications from pharmacy. Social worker will identify a pharmacy close to patient's residence in order for her to be able to assess medications as needed.    Meds ordered this encounter  Medications  . 0.9 %  sodium chloride infusion  . insulin aspart (novoLOG) injection 20 Units  . DISCONTD: sitaGLIPtin (JANUVIA) 50  MG tablet    Sig: Take 1 tablet (50 mg total) by mouth daily.    Dispense:  90 tablet    Refill:  2  . DISCONTD: glipiZIDE (GLUCOTROL) 5 MG tablet    Sig: Take 1 tablet (5 mg total) by mouth daily before breakfast.    Dispense:  60 tablet    Refill:  3  . DISCONTD: metFORMIN (GLUCOPHAGE) 1000 MG tablet    Sig: Take 1 tablet (1,000 mg total) by mouth 2 (two) times daily with a meal.    Dispense:  180 tablet    Refill:  3  . losartan (COZAAR) 50 MG tablet    Sig: Take 1 tablet (50 mg total) by mouth daily.    Dispense:  90 tablet    Refill:  3  . DISCONTD: hydrochlorothiazide (HYDRODIURIL) 12.5 MG tablet    Sig: Take 1 tablet (12.5 mg total) by mouth daily.    Dispense:  90 tablet    Refill:  3  . cloNIDine (CATAPRES) tablet 0.1 mg  . glipiZIDE (GLUCOTROL) 5 MG tablet    Sig: Take 1 tablet (5 mg total) by mouth daily before breakfast.    Dispense:  60 tablet    Refill:  3  . hydrochlorothiazide (HYDRODIURIL) 12.5 MG tablet    Sig: Take 1 tablet (12.5 mg total) by mouth daily.    Dispense:  90 tablet    Refill:  3  . metFORMIN (GLUCOPHAGE) 1000 MG tablet    Sig: Take 1 tablet (1,000 mg total) by mouth 2 (two) times daily with  a meal.    Dispense:  180 tablet    Refill:  3  . sitaGLIPtin (JANUVIA) 50 MG tablet    Sig: Take 1 tablet (50 mg total) by mouth daily.    Dispense:  90 tablet    Refill:  2     A total of 45 minutes spent, greater than 50 % of this time was spent managing hyperglycemia, changes in medication regimen, evaluation of patient in acute hyperglycemic state, management of elevated blood pressure, counseling and coordination of care. Time further extended as patient is non-English speaking requiring an in person interpreter.    -The patient was given clear instructions to go to ER or return to medical center if symptoms do not improve, worsen or new problems develop. The patient verbalized understanding.    Joaquin Courts, FNP [facility] at [facility] [address], [place] [address] [phone]: [phone]

## 2018-08-04 ENCOUNTER — Encounter: Payer: Self-pay | Admitting: Family Medicine

## 2018-08-05 ENCOUNTER — Ambulatory Visit: Payer: Medicaid Other | Admitting: Family Medicine

## 2018-11-13 ENCOUNTER — Telehealth: Payer: Self-pay

## 2018-11-13 NOTE — Telephone Encounter (Signed)
Called patient to do their pre-visit COVID screening.  Have you recently traveled internationally([place], [place], [place], [place], [place]) or within the [place] to a hotspot area([place], [place], [place], [place], [place])? no  Are you currently experiencing any of the following: fever, cough, SHOB, fatigue, body aches, loss of smell, rash, diarrhea, vomiting, severe headaches, weakness, sore throat? no  Have you been in contact with anyone who has recently travelled? no  Have you been in contact with anyone who is experiencing any of the above symptoms or been diagnosed with COVID  or works in or has recently visited a SNF? no  Asked patient to come in fasting for repeat lipid panel.

## 2018-11-14 ENCOUNTER — Encounter: Payer: Self-pay | Admitting: Family Medicine

## 2018-11-14 ENCOUNTER — Other Ambulatory Visit: Payer: Self-pay

## 2018-11-14 ENCOUNTER — Ambulatory Visit (INDEPENDENT_AMBULATORY_CARE_PROVIDER_SITE_OTHER): Payer: Medicaid Other | Admitting: Family Medicine

## 2018-11-14 VITALS — BP 150/95 | HR 77 | Temp 97.8°F | Resp 17 | Ht 66.0 in | Wt 181.6 lb

## 2018-11-14 DIAGNOSIS — E1165 Type 2 diabetes mellitus with hyperglycemia: Secondary | ICD-10-CM

## 2018-11-14 DIAGNOSIS — I10 Essential (primary) hypertension: Secondary | ICD-10-CM | POA: Diagnosis not present

## 2018-11-14 DIAGNOSIS — E119 Type 2 diabetes mellitus without complications: Secondary | ICD-10-CM

## 2018-11-14 DIAGNOSIS — Z1239 Encounter for other screening for malignant neoplasm of breast: Secondary | ICD-10-CM | POA: Diagnosis not present

## 2018-11-14 LAB — GLUCOSE, POCT (MANUAL RESULT ENTRY): POC Glucose: 259 mg/dl — AB (ref 70–99)

## 2018-11-14 MED ORDER — GLIMEPIRIDE 2 MG PO TABS: 2.0000 mg | ORAL_TABLET | Freq: Every day | ORAL | 1 refills | Status: DC

## 2018-11-14 MED ORDER — SITAGLIPTIN PHOSPHATE 100 MG PO TABS: 100.0000 mg | ORAL_TABLET | Freq: Every day | ORAL | 3 refills | Status: DC

## 2018-11-15 LAB — COMPREHENSIVE METABOLIC PANEL
ALT: 49 IU/L — ABNORMAL HIGH (ref 0–32)
AST: 34 IU/L (ref 0–40)
Albumin/Globulin Ratio: 1.7 (ref 1.2–2.2)
Albumin: 4.3 g/dL (ref 3.8–4.9)
Alkaline Phosphatase: 110 IU/L (ref 39–117)
BUN/Creatinine Ratio: 14 (ref 9–23)
BUN: 8 mg/dL (ref 6–24)
Bilirubin Total: 0.9 mg/dL (ref 0.0–1.2)
CO2: 24 mmol/L (ref 20–29)
Calcium: 9.3 mg/dL (ref 8.7–10.2)
Chloride: 103 mmol/L (ref 96–106)
Creatinine, Ser: 0.57 mg/dL (ref 0.57–1.00)
GFR calc Af Amer: 120 mL/min/{1.73_m2} (ref >59)
GFR calc non Af Amer: 104 mL/min/{1.73_m2} (ref >59)
Globulin, Total: 2.5 g/dL (ref 1.5–4.5)
Glucose: 255 mg/dL — ABNORMAL HIGH (ref 65–99)
Potassium: 4.1 mmol/L (ref 3.5–5.2)
Sodium: 141 mmol/L (ref 134–144)
Total Protein: 6.8 g/dL (ref 6.0–8.5)

## 2018-11-15 LAB — HEMOGLOBIN A1C
Est. average glucose Bld gHb Est-mCnc: 180 mg/dL
Hgb A1c MFr Bld: 7.9 % — ABNORMAL HIGH (ref 4.8–5.6)

## 2018-11-19 ENCOUNTER — Encounter: Payer: Self-pay | Admitting: Family Medicine

## 2018-11-20 NOTE — Progress Notes (Signed)
Patient ID: Diane Ponce, female    DOB: 03/24/1961, 58 y.o.   MRN: [id number]  PCP: Bing NeighborsHarris, Elissia Spiewak S, FNP  No chief complaint on file.   Subjective:  HPI Diane Ponce is a 58 y.o. female presents for evaluation diabetes and hypertension.  Patient is non-English speaking and is accompanied today by in person interpreter. Patient also does not read therefore is unable to identify medications that she is taking. She is however able to identify glipizide is causing some GI distress.  She endorses taking medication at times without food due to her current work schedule.  Patient since establishing here at the clinic has had very poorly controlled diabetes and she does not monitor glucose at home. Last A1C 12.1.  Blood pressure elevated on arrival at 150/95.  Patient reports that she is taking all of her medications every day.  Patient denies headache, shortness breath, chest pain, or new weakness.  Social History   Socioeconomic History  . Marital status: Married    Spouse name: Not on file  . Number of children: Not on file  . Years of education: Not on file  . Highest education level: Not on file  Occupational History  . Not on file  Social Needs  . Financial resource strain: Not on file  . Food insecurity    Worry: Not on file    Inability: Not on file  . Transportation needs    Medical: Not on file    Non-medical: Not on file  Tobacco Use  . Smoking status: Never Smoker  . Smokeless tobacco: Never Used  Substance and Sexual Activity  . Alcohol use: Never    Frequency: Never  . Drug use: Never  . Sexual activity: Not on file  Lifestyle  . Physical activity    Days per week: Not on file    Minutes per session: Not on file  . Stress: Not on file  Relationships  . Social Musicianconnections    Talks on phone: Not on file    Gets together: Not on file    Attends religious service: Not on file    Active member of club or organization: Not on file   Attends meetings of clubs or organizations: Not on file    Relationship status: Not on file  . Intimate partner violence    Fear of current or ex partner: Not on file    Emotionally abused: Not on file    Physically abused: Not on file    Forced sexual activity: Not on file  Other Topics Concern  . Not on file  Social History Narrative  . Not on file    No family history on file.   Review of Systems Pertinent negatives listed in HPI There are no active problems to display for this patient.   No Known Allergies  Prior to Admission medications   Medication Sig Start Date End Date Taking? Authorizing Provider  hydrochlorothiazide (HYDRODIURIL) 12.5 MG tablet Take 1 tablet (12.5 mg total) by mouth daily. 07/31/18  Yes Bing NeighborsHarris, Kirin Brandenburger S, FNP  losartan (COZAAR) 50 MG tablet Take 1 tablet (50 mg total) by mouth daily. 07/31/18  Yes Bing NeighborsHarris, Romel Dumond S, FNP  metFORMIN (GLUCOPHAGE) 1000 MG tablet Take 1 tablet (1,000 mg total) by mouth 2 (two) times daily with a meal. 07/31/18  Yes Bing NeighborsHarris, Reginaldo Hazard S, FNP  sitaGLIPtin (JANUVIA) 100 MG tablet Take 1 tablet (100 mg total) by mouth daily. 11/14/18  Yes Bing NeighborsHarris, Makylee Sanborn S, FNP  glimepiride (AMARYL) 2 MG  tablet Take 1 tablet (2 mg total) by mouth daily with breakfast. 11/14/18   Scot Jun, FNP   Past Medical, Surgical Family and Social History reviewed and updated.   Objective:   Today's Vitals   11/14/18 1106  BP: (!) 150/95  Pulse: 77  Resp: 17  Temp: 97.8 F (36.6 C)  TempSrc: Temporal  SpO2: 98%  Weight: 181 lb 9.6 oz (82.4 kg)  Height: 5\' 6"  (1.676 m)    Wt Readings from Last 3 Encounters:  11/14/18 181 lb 9.6 oz (82.4 kg)  07/31/18 188 lb (85.3 kg)  06/11/18 183 lb 12.8 oz (83.4 kg)     Physical Exam Pertinent negatives listed in HPI  Lab Results  Component Value Date   POCGLU 259 (A) 11/14/2018   POCGLU 341 (A) 07/31/2018   POCGLU 413 (A) 07/31/2018    Lab Results  Component Value Date   HGBA1C 7.9 (H)  11/14/2018    Assessment & Plan:  1. Type 2 diabetes mellitus with hyperglycemia, without long-term current use of insulin ([place]), checking a hemoglobin A1c today.  Glucose is elevated today here in office.  Patient has not taken any of her medications.  Encouraged to take medications upon arrival at home. Language barrier increases difficulty in promoting good glycemic control.  Discontinued glipizide today and replaced with glimepiride.  She states that her son is able to read and reiterated glipizide have been discontinued and to throw away the bottle.  She was advised to continue the metformin and Januvia as prescribed. - Hemoglobin A1c - Glucose (CBG)  2. Essential hypertension, uncontrolled, Patient has not taken any of her medication prior to today's visit therefore I am uncertain of her blood pressure control.  At her follow-up of her blood pressure remains elevated will titrate losartan. - Comprehensive metabolic panel  3. Screening for breast cancer - MM 3D SCREEN BREAST BILATERAL; Future     -The patient was given clear instructions to go to ER or return to medical center if symptoms do not improve, worsen or new problems develop. The patient verbalized understanding.    Molli Barrows, FNP [facility] [address], [place] [phone]: [phone]

## 2018-12-24 ENCOUNTER — Other Ambulatory Visit: Payer: Self-pay

## 2018-12-24 DIAGNOSIS — Z23 Encounter for immunization: Secondary | ICD-10-CM | POA: Diagnosis not present

## 2018-12-24 DIAGNOSIS — E1165 Type 2 diabetes mellitus with hyperglycemia: Secondary | ICD-10-CM

## 2018-12-24 MED ORDER — GLIMEPIRIDE 2 MG PO TABS: 2.0000 mg | ORAL_TABLET | Freq: Every day | ORAL | 0 refills | Status: AC

## 2018-12-24 MED ORDER — SITAGLIPTIN PHOSPHATE 100 MG PO TABS: 100.0000 mg | ORAL_TABLET | Freq: Every day | ORAL | 0 refills | Status: AC

## 2019-02-24 ENCOUNTER — Ambulatory Visit: Payer: Medicaid Other

## 2020-10-14 IMAGING — DX DG CHEST 2V
2 series · 2 of 2 positions shown · non-contrast
Comparison: None.

CLINICAL DATA: Hypertension, dizziness, palpitation

EXAM:
CHEST - 2 VIEW

[w chest pa]
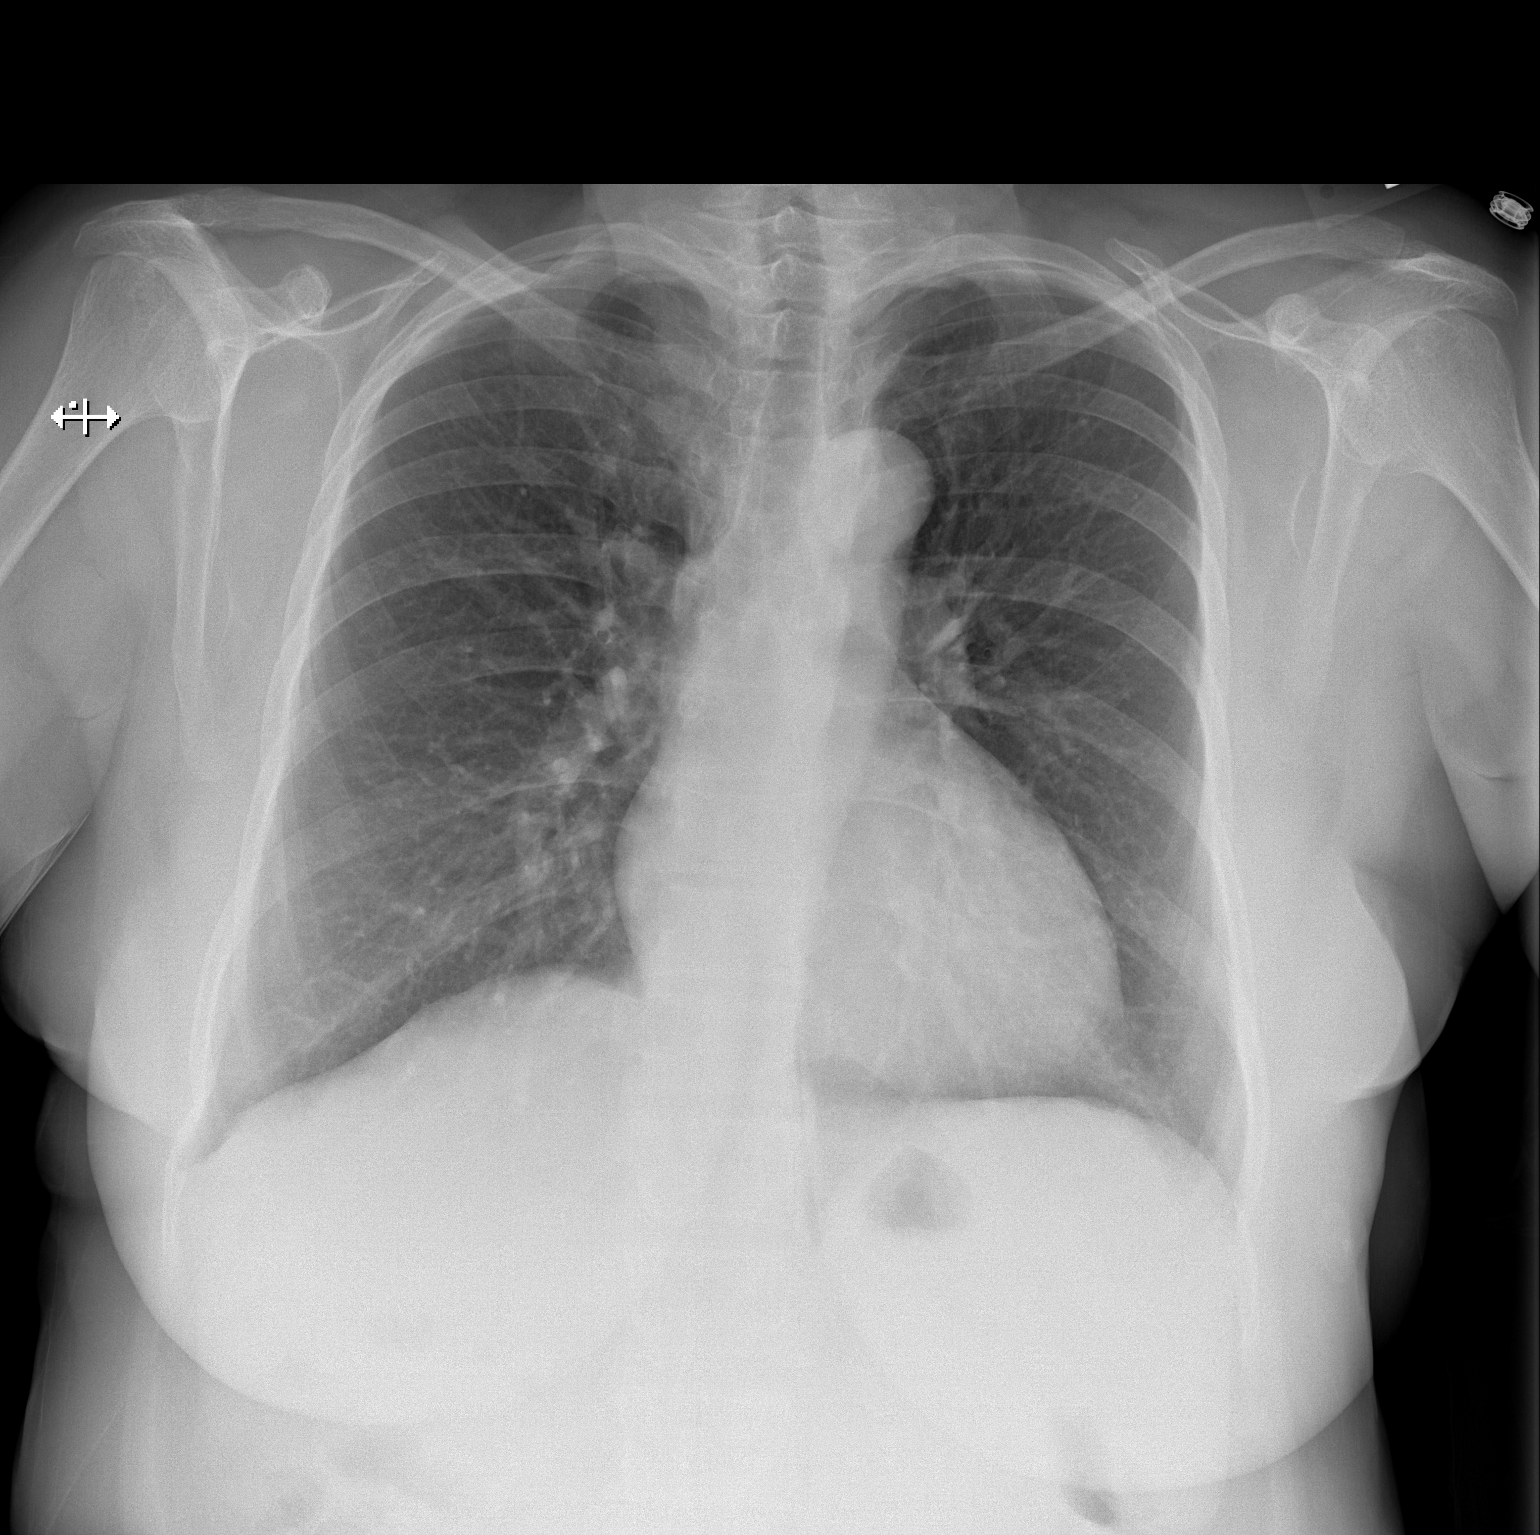

[w chest lat]
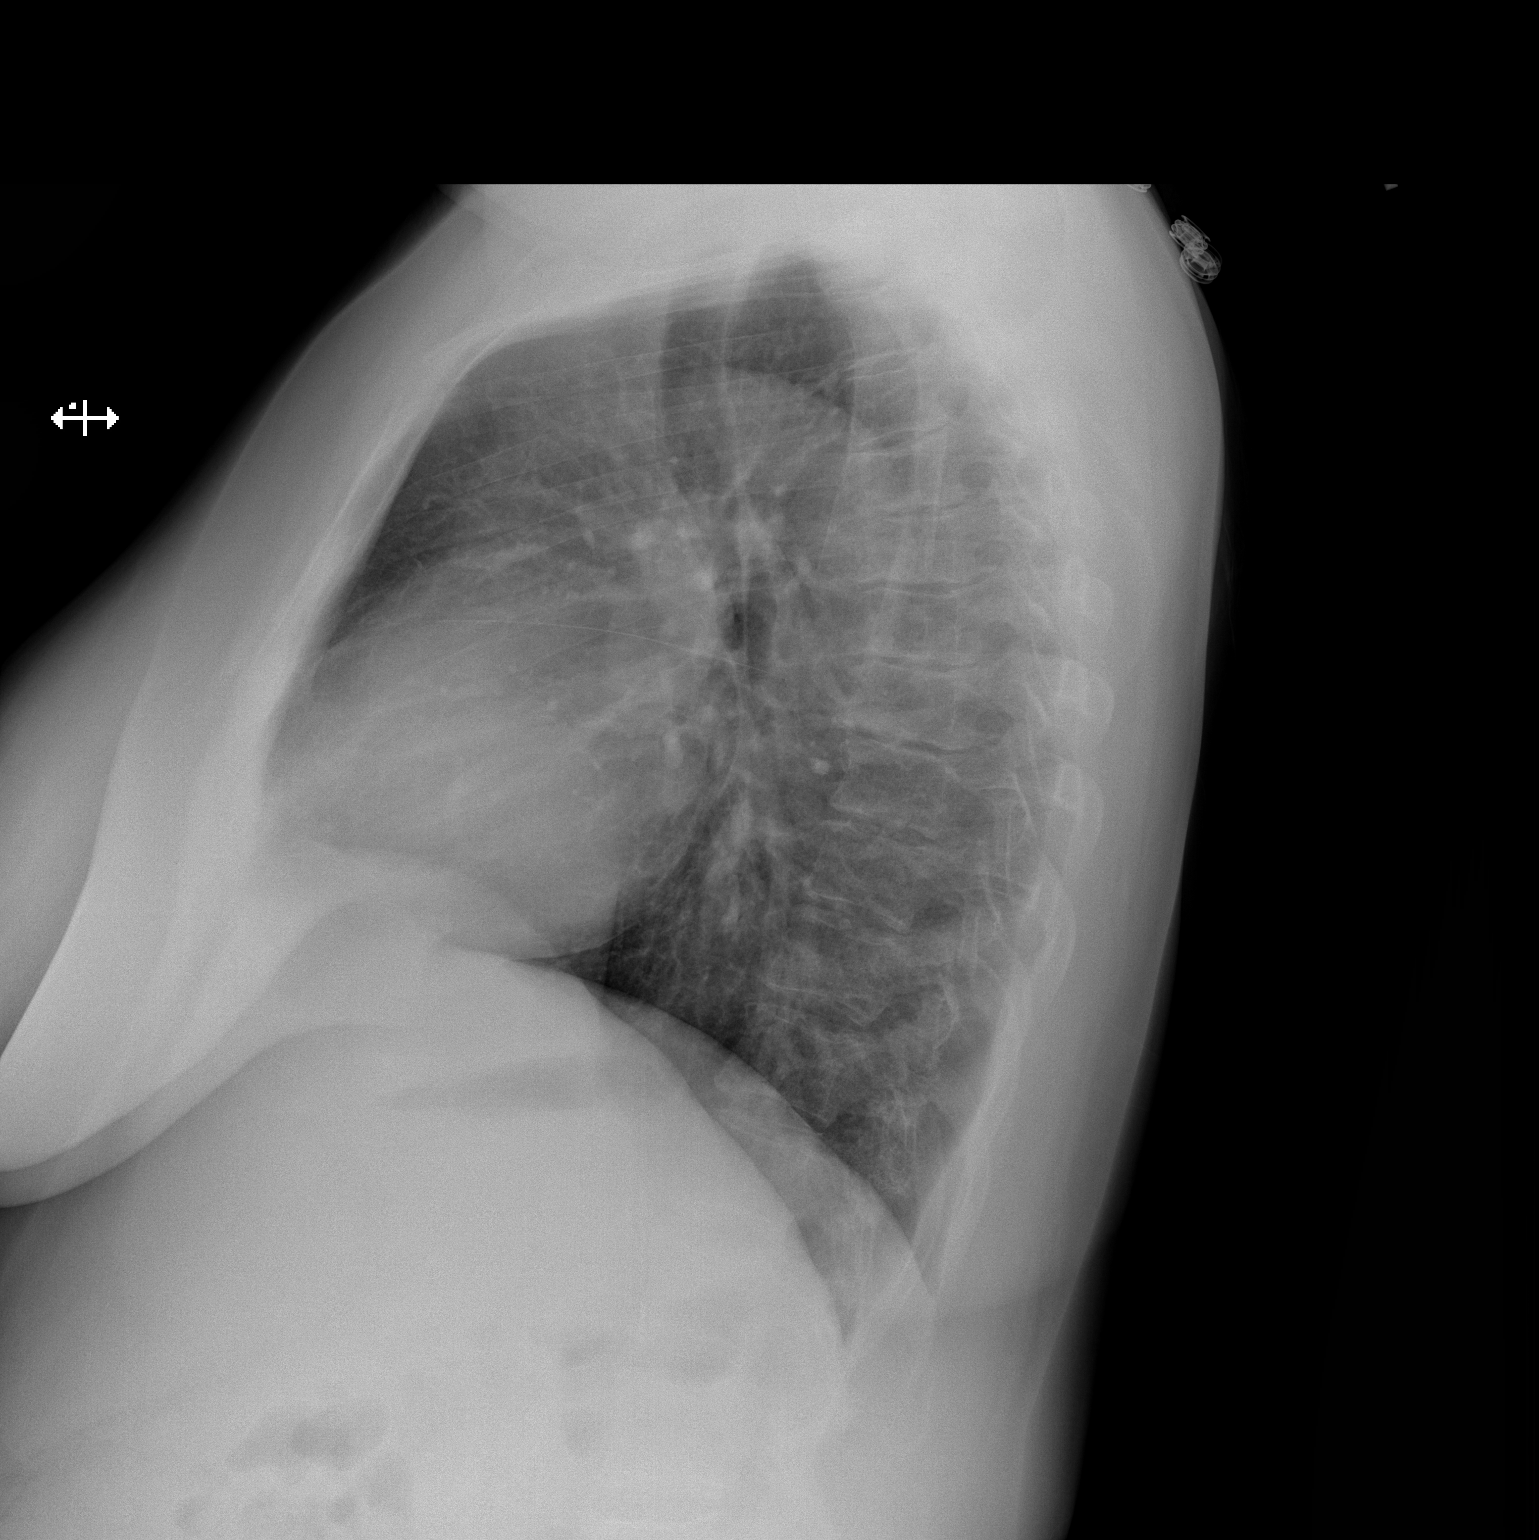

[2 of 2 positions shown; findings below may reference images not displayed]

FINDINGS: Lungs are clear.  No pleural effusion or pneumothorax.

The heart is normal in size.

Visualized osseous structures are within normal limits.
IMPRESSION: Normal chest radiographs.
